# Patient Record
Sex: Female | Born: 1955 | Race: White | Hispanic: No | Marital: Married | Smoking: Never smoker
Health system: Southern US, Community
[De-identification: ages and names within clinical notes are randomized; demographics above are authoritative.]

## PROBLEM LIST (undated history)

## (undated) DIAGNOSIS — E7800 Pure hypercholesterolemia, unspecified: Secondary | ICD-10-CM

## (undated) HISTORY — PX: CYST REMOVAL NECK: SHX6281

## (undated) HISTORY — PX: ABDOMINAL HYSTERECTOMY: SHX81

## (undated) HISTORY — PX: APPENDECTOMY: SHX54

---

## 2014-03-30 ENCOUNTER — Emergency Department (HOSPITAL_BASED_OUTPATIENT_CLINIC_OR_DEPARTMENT_OTHER)
Admission: EM | Admit: 2014-03-30 | Discharge: 2014-03-30 | Disposition: A | Discharge status: Home / Self Care | Payer: Commercial Managed Care - PPO | Attending: Emergency Medicine | Admitting: Emergency Medicine

## 2014-03-30 ENCOUNTER — Encounter (HOSPITAL_BASED_OUTPATIENT_CLINIC_OR_DEPARTMENT_OTHER): Payer: Self-pay | Admitting: Emergency Medicine

## 2014-03-30 ENCOUNTER — Emergency Department (HOSPITAL_BASED_OUTPATIENT_CLINIC_OR_DEPARTMENT_OTHER): Payer: Commercial Managed Care - PPO

## 2014-03-30 DIAGNOSIS — R6884 Jaw pain: Secondary | ICD-10-CM | POA: Diagnosis not present

## 2014-03-30 DIAGNOSIS — Z8639 Personal history of other endocrine, nutritional and metabolic disease: Secondary | ICD-10-CM | POA: Diagnosis not present

## 2014-03-30 DIAGNOSIS — Z79899 Other long term (current) drug therapy: Secondary | ICD-10-CM | POA: Diagnosis not present

## 2014-03-30 DIAGNOSIS — Z7951 Long term (current) use of inhaled steroids: Secondary | ICD-10-CM | POA: Diagnosis not present

## 2014-03-30 DIAGNOSIS — R200 Anesthesia of skin: Secondary | ICD-10-CM | POA: Diagnosis present

## 2014-03-30 HISTORY — DX: Pure hypercholesterolemia, unspecified: E78.00

## 2014-03-30 LAB — CBC WITH DIFFERENTIAL/PLATELET
BASOS ABS: 0 10*3/uL (ref 0.0–0.1)
BASOS PCT: 1 % (ref 0–1)
EOS PCT: 3 % (ref 0–5)
Eosinophils Absolute: 0.1 10*3/uL (ref 0.0–0.7)
HEMATOCRIT: 42.2 % (ref 36.0–46.0)
HEMOGLOBIN: 14 g/dL (ref 12.0–15.0)
LYMPHS PCT: 43 % (ref 12–46)
Lymphs Abs: 2.2 10*3/uL (ref 0.7–4.0)
MCH: 30.8 pg (ref 26.0–34.0)
MCHC: 33.2 g/dL (ref 30.0–36.0)
MCV: 92.7 fL (ref 78.0–100.0)
MONOS PCT: 11 % (ref 3–12)
Monocytes Absolute: 0.5 10*3/uL (ref 0.1–1.0)
Neutro Abs: 2.1 10*3/uL (ref 1.7–7.7)
Neutrophils Relative %: 42 % — ABNORMAL LOW (ref 43–77)
Platelets: 207 10*3/uL (ref 150–400)
RBC: 4.55 MIL/uL (ref 3.87–5.11)
RDW: 13.4 % (ref 11.5–15.5)
WBC: 5 10*3/uL (ref 4.0–10.5)

## 2014-03-30 LAB — COMPREHENSIVE METABOLIC PANEL
ALT: 33 U/L (ref 0–35)
AST: 25 U/L (ref 0–37)
Albumin: 4 g/dL (ref 3.5–5.2)
Alkaline Phosphatase: 87 U/L (ref 39–117)
Anion gap: 15 (ref 5–15)
BUN: 16 mg/dL (ref 6–23)
CALCIUM: 9.5 mg/dL (ref 8.4–10.5)
CO2: 22 meq/L (ref 19–32)
Chloride: 103 mEq/L (ref 96–112)
Creatinine, Ser: 0.8 mg/dL (ref 0.50–1.10)
GFR calc Af Amer: >90 mL/min (ref >90)
GFR calc non Af Amer: 79 mL/min — ABNORMAL LOW (ref >90)
Glucose, Bld: 104 mg/dL — ABNORMAL HIGH (ref 70–99)
Potassium: 4.4 mEq/L (ref 3.7–5.3)
Sodium: 140 mEq/L (ref 137–147)
Total Bilirubin: 0.2 mg/dL — ABNORMAL LOW (ref 0.3–1.2)
Total Protein: 7.5 g/dL (ref 6.0–8.3)

## 2014-03-30 LAB — TROPONIN I

## 2014-03-30 NOTE — ED Notes (Signed)
Pt sts she had an episode this morning of left jaw pain that resolved after about 30 mins and now thje jaw feels numb. She denies any other symptoms but sts that she "just doesn't feel right."

## 2014-03-30 NOTE — ED Notes (Signed)
MD at bedside. 

## 2014-03-30 NOTE — ED Provider Notes (Signed)
CSN: [id number]     Arrival date & time 03/30/14  [id number] History   First MD Initiated Contact with Patient 03/30/14 1039     Chief Complaint  Patient presents with  . Numbness     (Consider location/radiation/quality/duration/timing/severity/associated sxs/prior Treatment) HPI Pt presenting with c/o left jaw pain.  Pt states the pain was sharp and stabbing.  Symptoms came on for approx 30 minutes and then resolved.  States now her jaw feels strange, like the muscles are tired and the jaw is numb.  No chest pain,  No radiation of pain into neck or shoulder.  No sob, no nausea.  She states she felt somewhat more tired than usual initially but now she is back to her baseline.  No focal weakness or numbness.  No changes in vision or speech.  No fever.  There are no other associated systemic symptoms, there are no other alleviating or modifying factors.   Past Medical History  Diagnosis Date  . Hypercholesterolemia    Past Surgical History  Procedure Laterality Date  . Appendectomy    . Abdominal hysterectomy    . Cyst removal neck     No family history on file. History  Substance Use Topics  . Smoking status: Never Smoker   . Smokeless tobacco: Not on file  . Alcohol Use: No   OB History   Grav Para Term Preterm Abortions TAB SAB Ect Mult Living                 Review of Systems ROS reviewed and all otherwise negative except for mentioned in HPI    Allergies  Review of patient's allergies indicates no known allergies.  Home Medications   Prior to Admission medications   Medication Sig Start Date End Date Taking? Authorizing Provider  budesonide-formoterol (SYMBICORT) 160-4.5 MCG/ACT inhaler Inhale 2 puffs into the lungs 2 (two) times daily.   Yes Historical Provider, MD  estradiol (VIVELLE-DOT) 0.075 MG/24HR Place 1 patch onto the skin 2 (two) times a week.   Yes Historical Provider, MD  pantoprazole (PROTONIX) 40 MG tablet Take 40 mg by mouth daily.   Yes Historical  Provider, MD   BP 129/71  Pulse 74  Temp(Src) 98 F (36.7 C) (Oral)  Resp 19  Ht 5\' 2"  (1.575 m)  Wt 170 lb (77.111 kg)  BMI 31.09 kg/m2  SpO2 98% Vitals reviewed Physical Exam Physical Examination: General appearance - alert, well appearing, and in no distress Mental status - alert, oriented to person, place, and time Eyes - PERRL, EOMI Mouth - mucous membranes moist, pharynx normal without lesions, no trismus, no ttp over TMJ joint Chest - clear to auscultation, no wheezes, rales or rhonchi, symmetric air entry Heart - normal rate, regular rhythm, normal S1, S2, no murmurs, rubs, clicks or gallops Extremities - peripheral pulses normal, no pedal edema, no clubbing or cyanosis Skin - normal coloration and turgor, no rashes  ED Course  Procedures (including critical care time) Labs Review Labs Reviewed  CBC WITH DIFFERENTIAL - Abnormal; Notable for the following:    Neutrophils Relative % 42 (*)    All other components within normal limits  COMPREHENSIVE METABOLIC PANEL - Abnormal; Notable for the following:    Glucose, Bld 104 (*)    Total Bilirubin 0.2 (*)    GFR calc non Af Amer 79 (*)    All other components within normal limits  TROPONIN I  TROPONIN I    Imaging Review No results found.  EKG Interpretation   Date/Time:  Friday March 30 2014 09:48:24 EDT Ventricular Rate:  75 PR Interval:  170 QRS Duration: 82 QT Interval:  416 QTC Calculation: 464 R Axis:   15 Text Interpretation:  Normal sinus rhythm with sinus arrhythmia Cannot  rule out Anterior infarct , age undetermined Abnormal ECG No old tracing  to compare Confirmed by [facility]  MD, MARTHA [id number]) on 03/30/2014 12:52:13  PM      MDM   Final diagnoses:  Jaw pain    Pt presenting with c/o jaw pain, this has now resolved.  EKG reassuring, no hx of CAD, does have hypercholesterolemia but TIMI and heart score are both low.  Did obtain 2 sets of troponin.  These were both negative so doubt  anginal equivalent or ACS.  Head CT reassuring and doubt these symptoms represented TIA.  Pulses normal in carotids, and bilateral upper extremities.  Pt is feeling much improved and is requesting discharge. Discharged with strict return precautions.  Pt agreeable with plan.    Ethelda ChickMartha K Linker, MD 04/02/14 [phone]

## 2014-03-30 NOTE — ED Notes (Signed)
D/c home with family- pt given CD of CT scan for f/u

## 2014-03-30 NOTE — Discharge Instructions (Signed)
Return to the ED with any concerns including increased pain, difficulty swallowing or breathing, chest pain, weakness or numbness of arm, changes in vision or speech, headache, decreased level of alertness/lethargy, or any other alarming symptoms  The labs as well as head CT performed today in the ED were reassuring, you should be sure to arrange for a recheck with your primary care doctor

## 2016-04-26 IMAGING — CT CT HEAD W/O CM
1 series · 16 of 30 positions shown, 20 images · non-contrast
Comparison: None.

CLINICAL DATA: Left jaw numbness.

EXAM:
CT HEAD WITHOUT CONTRAST
TECHNIQUE: Contiguous axial images were obtained from the base of the skull
through the vertex without intravenous contrast.

[Series 2: head 4.8 h37s · axial · 0.47mm/px · z∈[-156,+4]mm · 16 of 36 slices shown, 20 images]
[im 2/36  brain]
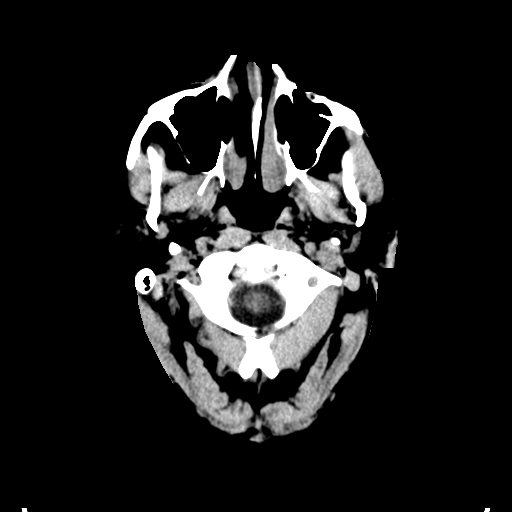
[im 2/36  bone]
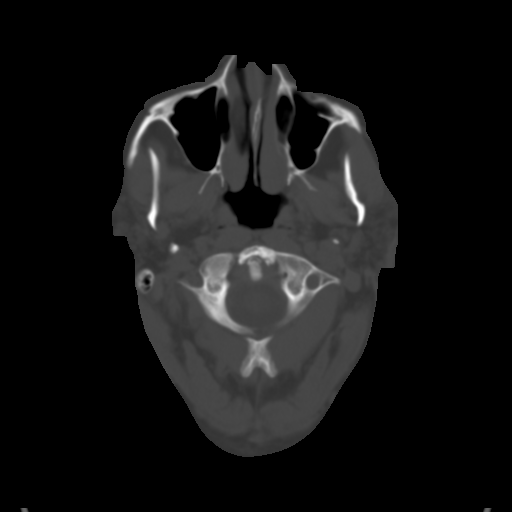
[im 4/36  brain]
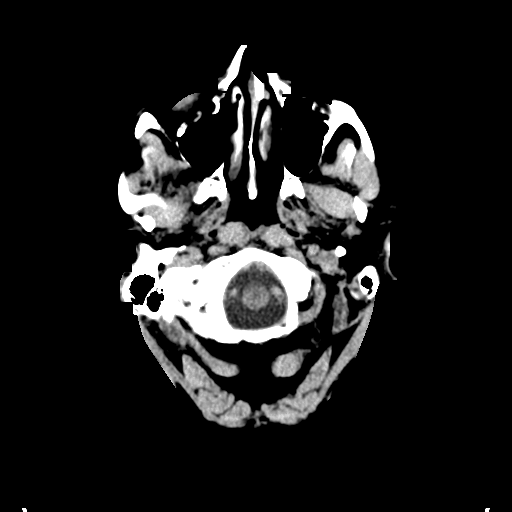
[im 7/36  brain]
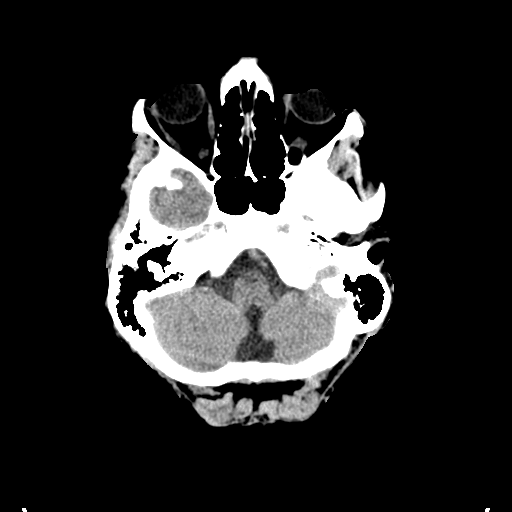
[im 9/36  brain]
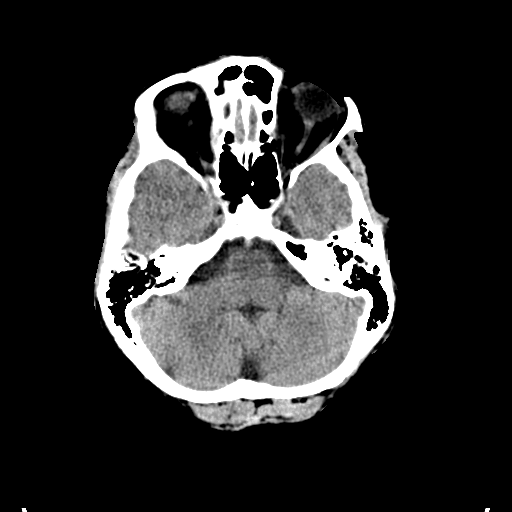
[im 10/36  brain]
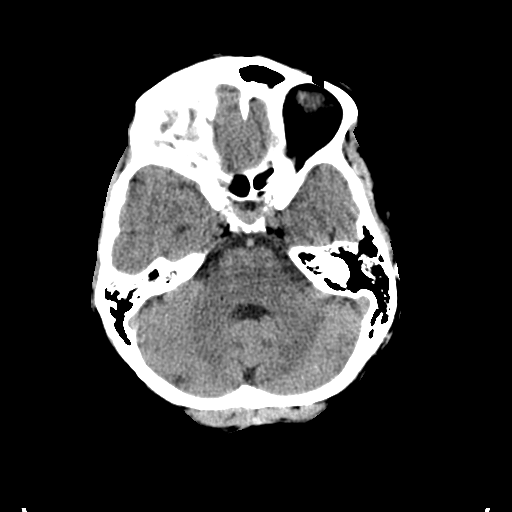
[im 10/36  bone]
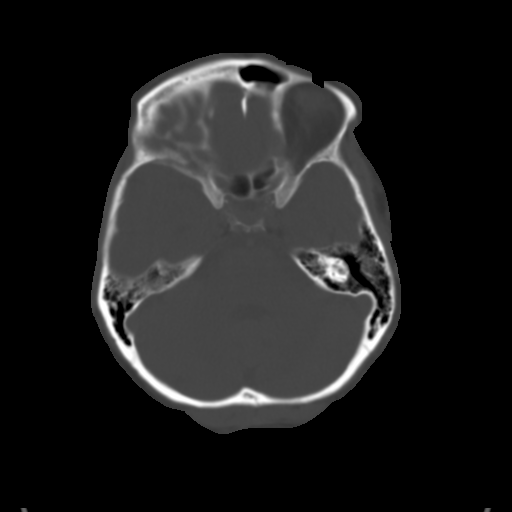
[im 13/36  brain]
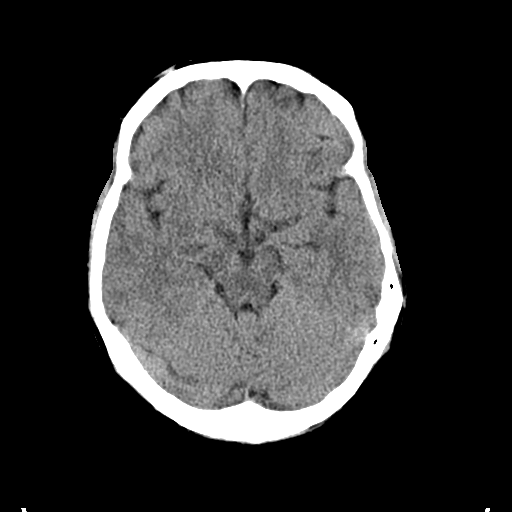
[im 15/36  brain]
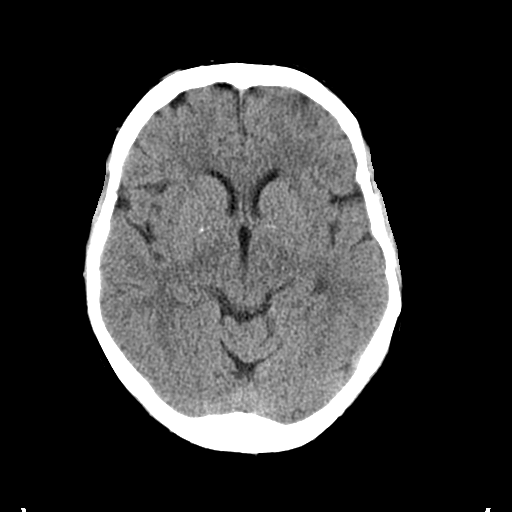
[im 17/36  brain]
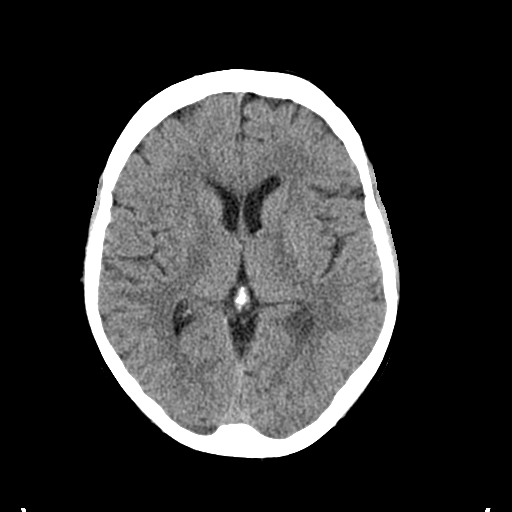
[im 19/36  brain]
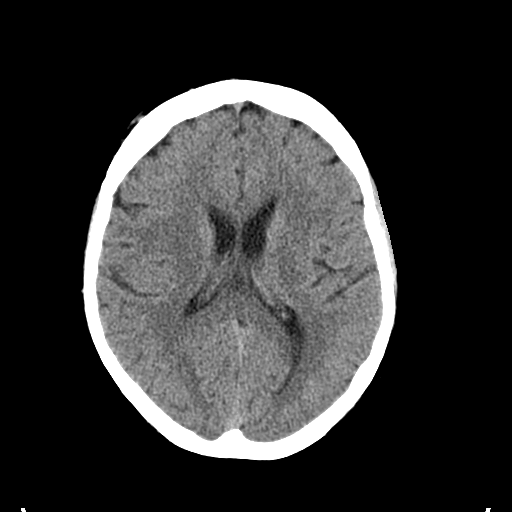
[im 19/36  bone]
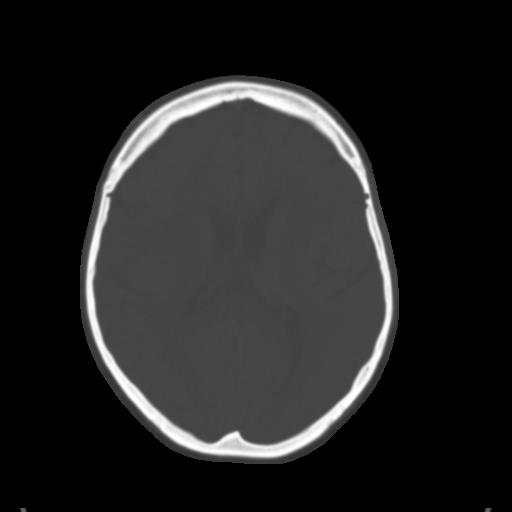
[im 21/36  brain]
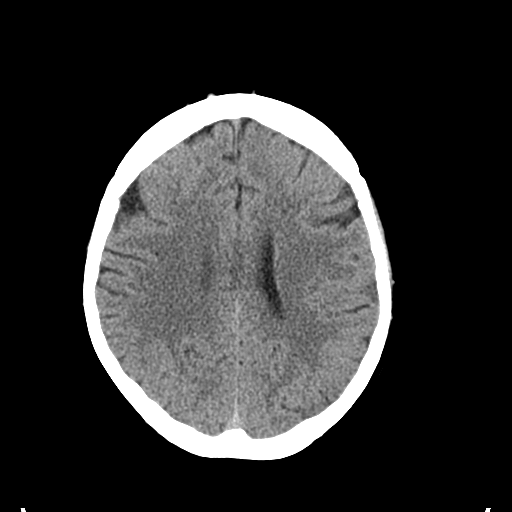
[im 23/36  brain]
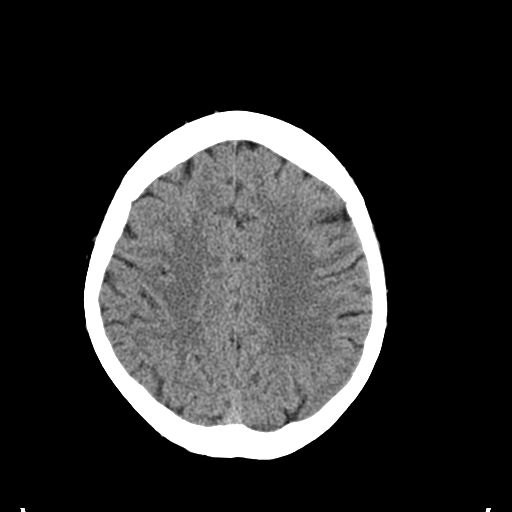
[im 26/36  brain]
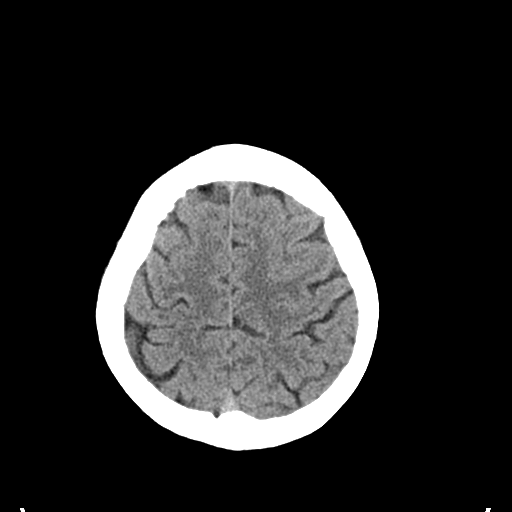
[im 27/36  brain]
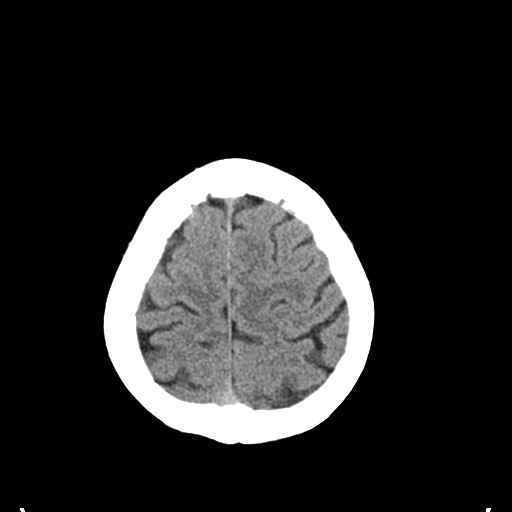
[im 27/36  bone]
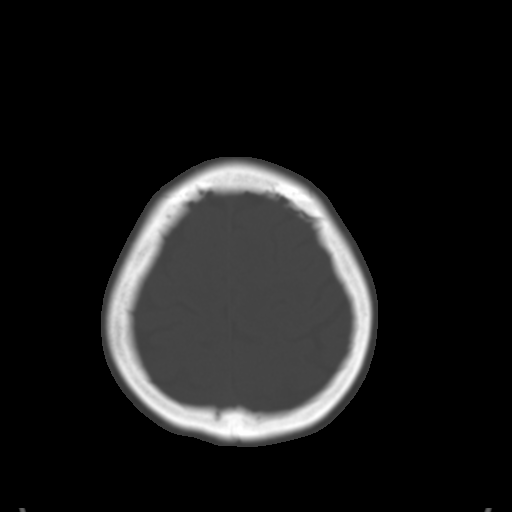
[im 29/36  brain]
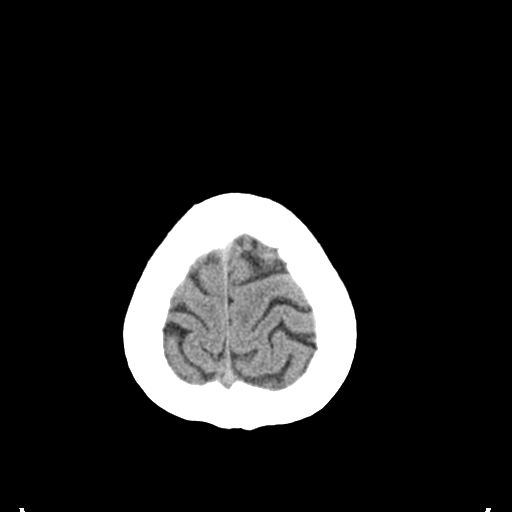
[im 32/36  brain]
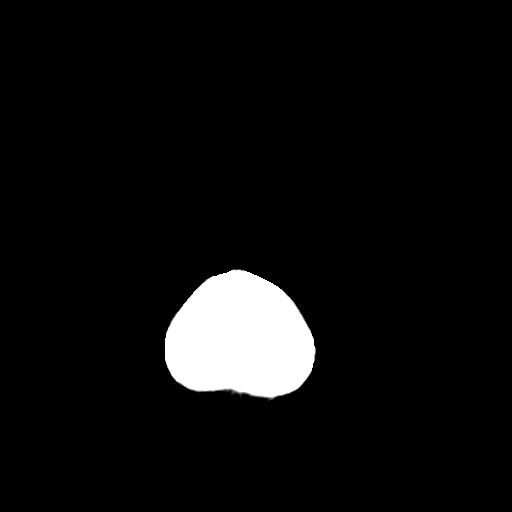
[im 34/36  brain]
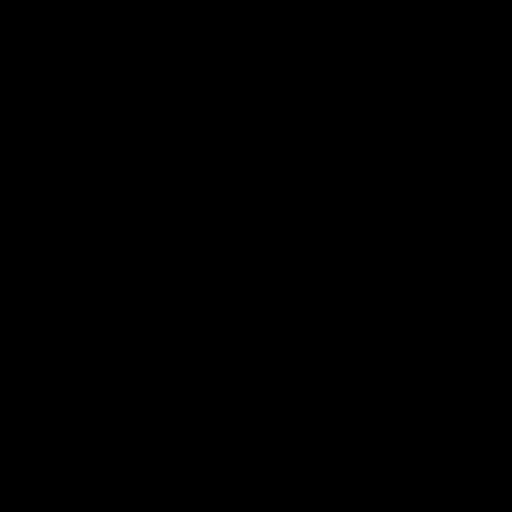

[16 of 30 positions shown; findings below may reference images not displayed]

FINDINGS: Bony calvarium appears intact. No mass effect or midline shift is
noted. Ventricular size is within normal limits. There is no
evidence of mass lesion, hemorrhage or acute infarction.
IMPRESSION: Normal head CT.
